# Patient Record
Sex: Male | Born: 1940 | Race: White | Marital: Married | Smoking: Former smoker
Health system: Southern US, Community
[De-identification: ages and names within clinical notes are randomized; demographics above are authoritative.]

## PROBLEM LIST (undated history)

## (undated) DIAGNOSIS — H9319 Tinnitus, unspecified ear: Secondary | ICD-10-CM

## (undated) DIAGNOSIS — R413 Other amnesia: Secondary | ICD-10-CM

## (undated) HISTORY — DX: Tinnitus, unspecified ear: H93.19

## (undated) HISTORY — DX: Other amnesia: R41.3

## (undated) HISTORY — PX: KNEE SURGERY: SHX244

---

## 2015-01-04 ENCOUNTER — Encounter: Payer: Self-pay | Admitting: Specialist

## 2015-01-10 ENCOUNTER — Other Ambulatory Visit (INDEPENDENT_AMBULATORY_CARE_PROVIDER_SITE_OTHER): Payer: Self-pay | Admitting: Otolaryngology

## 2015-01-10 DIAGNOSIS — H918X9 Other specified hearing loss, unspecified ear: Secondary | ICD-10-CM

## 2015-01-21 ENCOUNTER — Ambulatory Visit
Admission: RE | Admit: 2015-01-21 | Discharge: 2015-01-21 | Disposition: A | Discharge status: Home / Self Care | Payer: Medicare Other | Source: Ambulatory Visit | Attending: Otolaryngology | Admitting: Otolaryngology

## 2015-01-21 DIAGNOSIS — H918X9 Other specified hearing loss, unspecified ear: Secondary | ICD-10-CM

## 2015-01-21 MED ORDER — GADOBENATE DIMEGLUMINE 529 MG/ML IV SOLN
18.0000 mL | Freq: Once | INTRAVENOUS | Status: AC | PRN
  Administered 2015-01-21: 18 mL via INTRAVENOUS

## 2015-06-04 ENCOUNTER — Encounter: Payer: Self-pay | Admitting: Neurology

## 2015-06-04 ENCOUNTER — Telehealth: Payer: Self-pay | Admitting: Neurology

## 2015-06-04 ENCOUNTER — Ambulatory Visit (INDEPENDENT_AMBULATORY_CARE_PROVIDER_SITE_OTHER): Payer: Medicare Other | Admitting: Neurology

## 2015-06-04 VITALS — BP 116/62 | HR 61 | Ht 66.0 in | Wt 185.0 lb

## 2015-06-04 DIAGNOSIS — F0390 Unspecified dementia without behavioral disturbance: Secondary | ICD-10-CM

## 2015-06-04 MED ORDER — MEMANTINE HCL 10 MG PO TABS: 10.0000 mg | ORAL_TABLET | Freq: Two times a day (BID) | ORAL | Status: DC

## 2015-06-04 NOTE — Progress Notes (Addendum)
PATIENT: Carlos Cardenas DOB: 02/10/1941  Chief Complaint  Patient presents with  . Memory Loss    MMSE 21/30 - 5 animals. He is here with his wife, Carlos Cardenas.  Reports memory problems have been present for the last year.     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  Wife noticed he has gradual onset memory trouble around 2014, tends to repeat himself, got lost while driving, withdraw at the social event, distance himself from conversations, he put frozen T-cell with cardboard into the stove,   His sister was diagnosed with Alzheimer's disease at age eighties, his father died of age 76 from tuberculosis, mother died at age 76, does not has dementia,  He apparently has word finding difficulties during today's interview   Ported normal TSH, CBC, CMP  REVIEW OF SYSTEMS: Full 14 system review of systems performed and notable only for memory loss, confusion, ringing ears  ALLERGIES: No Known Allergies  HOME MEDICATIONS: Current Outpatient Prescriptions  Medication Sig Dispense Refill  . Apoaequorin (PREVAGEN) 10 MG CAPS Take 10 mg by mouth daily.    Marland Kitchen. ibuprofen (ADVIL,MOTRIN) 200 MG tablet Take 200 mg by mouth daily.     No current facility-administered medications for this visit.    PAST MEDICAL HISTORY: Past Medical History  Diagnosis Date  . Ringing in ears   . Memory loss     PAST SURGICAL HISTORY: Past Surgical History  Procedure Laterality Date  . Knee surgery Right     2000    FAMILY HISTORY: Family History  Problem Relation Age of Onset  . Heart disease Mother   . Tuberculosis  Father     SOCIAL HISTORY:  Social History   Social History  . Marital Status: Married    Spouse Name: N/A  . Number of Children: 2  . Years of Education: Some clg   Occupational History  . [employer] - Glass blower/designerswim teacher    Social History Main Topics  . Smoking status: Former Games developermoker  . Smokeless tobacco: Not on file     Comment: Quit 1965  . Alcohol Use: 0.0 oz/week    0 Standard drinks or equivalent per week     Comment: Seldom  . Drug Use: No  . Sexual Activity: Not on file   Other Topics Concern  . Not on file   Social History Narrative   Lives at home with his wife.   Left-handed.   2-3 cups caffeine per day.     PHYSICAL EXAM   Filed Vitals:   06/04/15 1038  BP: 116/62  Pulse: 61  Height: 5\' 6"  (1.676 m)  Weight: 185 lb (83.915 kg)    Not recorded      Body mass index is 29.87 kg/(m^2).  PHYSICAL EXAMNIATION:  Gen: NAD, conversant, well nourised, obese, well groomed                     Cardiovascular: Regular rate rhythm, no peripheral edema, warm, nontender. Eyes: Conjunctivae clear without exudates or hemorrhage Neck: Supple, no carotid bruise. Pulmonary: Clear to auscultation bilaterally   NEUROLOGICAL EXAM:  MENTAL  STATUS: Speech:    Speech is normal; fluent and spontaneous with normal comprehension.  Cognition: Mini-Mental Status Examination is 22 out of 30     Orientation to time, place and person: He is not oriented to year  Recent and remote memory: He missed 2 out of 3 recalls  Attention span and concentration: He has difficulty spell world backwards      Normal Language, naming, repeating,spontaneous speech     Fund of knowledge   CRANIAL NERVES: CN II: Visual fields are full to confrontation. Fundoscopic exam is normal with sharp discs and no vascular changes. Pupils are round equal and briskly reactive to light. CN III, IV, VI: extraocular movement are normal. No ptosis. CN V: Facial sensation is intact to pinprick in all 3 divisions  bilaterally. Corneal responses are intact.  CN VII: Face is symmetric with normal eye closure and smile. CN VIII: Hearing is normal to rubbing fingers CN IX, X: Palate elevates symmetrically. Phonation is normal. CN XI: Head turning and shoulder shrug are intact CN XII: Tongue is midline with normal movements and no atrophy.  MOTOR: There is no pronator drift of out-stretched arms. Muscle bulk and tone are normal. Muscle strength is normal.  REFLEXES: Reflexes are 2+ and symmetric at the biceps, triceps, knees, and ankles. Plantar responses are flexor.  SENSORY: Intact to light touch, pinprick, position sense, and vibration sense are intact in fingers and toes.  COORDINATION: Rapid alternating movements and fine finger movements are intact. There is no dysmetria on finger-to-nose and heel-knee-shin.    GAIT/STANCE: Posture is normal. Gait is steady with normal steps, base, arm swing, and turning. Heel and toe walking are normal. Tandem gait is normal.  Romberg is absent.   DIAGNOSTIC DATA (LABS, IMAGING, TESTING) - I reviewed patient records, labs, notes, testing and imaging myself where available.   ASSESSMENT AND PLAN  Carlos Cardenas is a 74 y.o. male  presented with gradual onset memory trouble, family history of dementia   Most consistent with Alzheimer's dementia  MRI of the brain showed generalized atrophy especially perisylvian  area atrophy, small vessel disease  Complete laboratory evaluations  Started Namenda 10 mg twice a day  He is very interested in research trial, information was provided  Return to clinic in 3 months  Carlos Cardenas, M.D. Ph.D.  [facility] [address] [place], [place] [address] Ph: [phone] Fax: [phone]  [id number] Carlos Laroche, MD

## 2015-06-04 NOTE — Telephone Encounter (Signed)
Carlos Cardenas and his wife met with Maida Sale to talk about the CREAD study.  The patient left their follow up papers in the office.  I called and spoke with Mrs. Zenon and she requested that I mail her the follow up paperwork.

## 2015-06-05 LAB — SEDIMENTATION RATE: Sed Rate: 4 mm/hr (ref 0–30)

## 2015-06-05 LAB — VITAMIN B12: Vitamin B-12: 522 pg/mL (ref 211–946)

## 2015-06-05 LAB — ANA W/REFLEX IF POSITIVE: Anti Nuclear Antibody(ANA): NEGATIVE

## 2015-06-05 LAB — C-REACTIVE PROTEIN: CRP: 3.8 mg/L (ref 0.0–4.9)

## 2015-06-05 LAB — RPR: RPR Ser Ql: NONREACTIVE

## 2015-06-10 ENCOUNTER — Telehealth: Payer: Self-pay | Admitting: *Deleted

## 2015-06-10 NOTE — Telephone Encounter (Signed)
-----   Message from Levert FeinsteinYijun Yan, MD sent at 06/05/2015 11:03 PM EST ----- Please call patient for normal laboratory result

## 2015-06-10 NOTE — Telephone Encounter (Signed)
Spoke to patient - aware of results. 

## 2015-07-09 ENCOUNTER — Telehealth: Payer: Self-pay

## 2015-07-09 NOTE — Telephone Encounter (Signed)
I spoke to the patient in regards to the CREAD study. The patient is interested in participating. Therefore, an FCSRT visit was scheduled for 23FEB2017.

## 2015-07-09 NOTE — Telephone Encounter (Signed)
I left a message for the patient to return my call.

## 2015-08-14 DIAGNOSIS — H903 Sensorineural hearing loss, bilateral: Secondary | ICD-10-CM | POA: Diagnosis not present

## 2015-09-03 ENCOUNTER — Telehealth: Payer: Self-pay

## 2015-09-03 ENCOUNTER — Encounter: Payer: Self-pay | Admitting: Neurology

## 2015-09-03 ENCOUNTER — Ambulatory Visit (INDEPENDENT_AMBULATORY_CARE_PROVIDER_SITE_OTHER): Payer: PPO | Admitting: Neurology

## 2015-09-03 VITALS — BP 132/65 | HR 62 | Ht 66.0 in | Wt 181.0 lb

## 2015-09-03 DIAGNOSIS — F0390 Unspecified dementia without behavioral disturbance: Secondary | ICD-10-CM

## 2015-09-03 MED ORDER — DONEPEZIL HCL 10 MG PO TABS: 10.0000 mg | ORAL_TABLET | Freq: Every day | ORAL | Status: DC

## 2015-09-03 NOTE — Telephone Encounter (Signed)
Patient returned my call in re the CREAD study. I explained to the patient that since Aricept has been prescribed by Dr. Terrace Arabia on 21FEB2017, we would need to wait for him to be on a stable dose for at least 3 months before becoming eligible for the study. I told him that we would give him a call in 3 months. Patient voiced understanding.

## 2015-09-03 NOTE — Progress Notes (Signed)
Chief Complaint  Patient presents with  . Memory Loss    MMSE 25/30 - 4 animals.  He is here with his wife, Carlos Cardenas. He is having problems tolerating Namenda.  Says he often feels dizzy and has vision changes after his doses.      PATIENT: Carlos Cardenas DOB: 1940-11-16  Chief Complaint  Patient presents with  . Memory Loss    MMSE 25/30 - 4 animals.  He is here with his wife, Carlos Cardenas. He is having problems tolerating Namenda.  Says he often feels dizzy and has vision changes after his doses.     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  Wife noticed he has gradual onset memory trouble around 2014, tends to repeat himself, got lost while driving, withdraw at the social event, distance himself from conversations, he put frozen T-cell with cardboard into the stove,   His sister was diagnosed with Alzheimer's disease at age 24, his father died of age 31 from tuberculosis, mother died at age 66, does not has dementia,  He apparently has word finding difficulties during today's interview   Reported normal TSH, CBC, CMP  Update September 03 2015: He started to take Namenda 10 mg twice a day since November 2016, complains of mild blurry vision, on further questioning, he has developed nearsighted, only get over-the-counter reading glasses, I have suggested him seeing his optometrist.  We have personally reviewed MRI of the brain in July 2016, generalized atrophy, more at the perisylvarian fissure, left worse than right, mild supratentorium small vessel disease  I reviewed laboratory evaluation, normal on  negative ANA, ESR, C-reactive protein, B12, RPR     REVIEW OF SYSTEMS: Full 14 system review of systems performed and notable only for ringing ears, runny nose, double vision, blurred vision, constipation, back pain, memory loss, confusion  ALLERGIES: No Known Allergies  HOME MEDICATIONS: Current Outpatient Prescriptions  Medication Sig Dispense Refill  . Apoaequorin (PREVAGEN PO) Take by mouth daily.    . Magnesium 400 MG CAPS Take by mouth daily.    . memantine (NAMENDA) 10 MG tablet Take 1 tablet (10 mg total) by mouth 2 (two) times daily. 60 tablet 11  . Multiple Vitamins-Minerals (MULTIVITAMIN ADULT PO) Take by mouth daily.    . Omega-3 Fatty Acids (FISH OIL) 1000 MG CAPS Take 2,000 mg by mouth daily.    . vitamin E 400 UNIT capsule Take 400 Units by mouth daily.     No current facility-administered medications for this visit.    PAST MEDICAL HISTORY: Past Medical History  Diagnosis Date  . Ringing in ears   . Memory loss     PAST SURGICAL HISTORY: Past Surgical History  Procedure Laterality Date  . Knee surgery Right     2000    FAMILY HISTORY: Family History  Problem Relation Age of Onset  . Heart disease Mother   . Tuberculosis Father     SOCIAL HISTORY:  Social History   Social History  . Marital Status: Married    Spouse Name: N/A  . Number of Children: 2  . Years of Education: Some clg  Occupational History  . 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 Smoking status: Former Research scientist (life sciences)  . Smokeless tobacco: Not on file     Comment: Quit 1965  . Alcohol Use: 0.0 oz/week    0 Standard drinks or equivalent per week     Comment: Seldom  . Drug Use: No  . Sexual Activity: Not on file   Other Topics Concern  . Not on file   Social History Narrative   Lives at home with his wife.   Left-handed.   2-3 cups caffeine per day.     PHYSICAL EXAM   Filed Vitals:   09/03/15 0840  BP: 132/65  Pulse: 62  Height: 5' 6"  (1.676 m)    Weight: 181 lb (82.101 kg)    Not recorded      Body mass index is 29.23 kg/(m^2).  PHYSICAL EXAMNIATION:  Gen: NAD, conversant, well nourised, obese, well groomed                     Cardiovascular: Regular rate rhythm, no peripheral edema, warm, nontender. Eyes: Conjunctivae clear without exudates or hemorrhage Neck: Supple, no carotid bruise. Pulmonary: Clear to auscultation bilaterally   NEUROLOGICAL EXAM:  MENTAL STATUS: Speech:    Speech is normal; fluent and spontaneous with normal comprehension.  Cognition: Mini-Mental Status Examination is 25 out of 30     Orientation to time, place and person: He is not oriented to date  Recent and remote memory: He missed 1 out of 3 recalls  Attention span and concentration: He has difficulty spell world backwards      Normal Language, naming, repeating,spontaneous speech     Fund of knowledge   CRANIAL NERVES: CN II: Visual fields are full to confrontation. Fundoscopic exam is normal with sharp discs and no vascular changes. Pupils are round equal and briskly reactive to light. CN III, IV, VI: extraocular movement are normal. No ptosis. CN V: Facial sensation is intact to pinprick in all 3 divisions bilaterally. Corneal responses are intact.  CN VII: Face is symmetric with normal eye closure and smile. CN VIII: Hearing is normal to rubbing fingers CN IX, X: Palate elevates symmetrically. Phonation is normal. CN XI: Head turning and shoulder shrug are intact CN XII: Tongue is midline with normal movements and no atrophy.  MOTOR: There is no pronator drift of out-stretched arms. Muscle bulk and tone are normal. Muscle strength is normal.  REFLEXES: Reflexes are 2+ and symmetric at the biceps, triceps, knees, and ankles. Plantar responses are flexor.  SENSORY: Intact to light touch, pinprick, position sense, and vibration sense are intact in fingers and toes.  COORDINATION: Rapid alternating movements and fine finger  movements are intact. There is no dysmetria on finger-to-nose and heel-knee-shin.    GAIT/STANCE: Posture is normal. Gait is steady with normal steps, base, arm swing, and turning. Heel and toe walking are normal. Tandem gait is normal.  Romberg is absent.   DIAGNOSTIC DATA (LABS, IMAGING, TESTING) - I reviewed patient records, labs, notes, testing and imaging myself where available.   ASSESSMENT AND PLAN  Carlos Cardenas is a 75 y.o. male  presented with gradual onset memory trouble, family history of dementia   Most consistent with Alzheimer's dementia  We have personally reviewed MRI of the brain showed generalized atrophy especially perisylvian  area atrophy, small vessel disease  Laboratory evaluations Showed no treatable cause  Continue Namenda 10 mg twice a day  Add on Aricept 10 mg daily   Patient and his wife are very interested in research trial, information was provided  Return to clinic in 3 months  Carlos Cardenas, M.D. Ph.D.  [facility] [address], [place],  [address] Ph: [phone] Fax: [phone]  [id number] Carlos Roller, MD

## 2015-09-03 NOTE — Telephone Encounter (Signed)
I left a message for the patient to return my call.

## 2015-09-17 DIAGNOSIS — G245 Blepharospasm: Secondary | ICD-10-CM | POA: Diagnosis not present

## 2015-09-17 DIAGNOSIS — H5212 Myopia, left eye: Secondary | ICD-10-CM | POA: Diagnosis not present

## 2015-09-17 DIAGNOSIS — H2513 Age-related nuclear cataract, bilateral: Secondary | ICD-10-CM | POA: Diagnosis not present

## 2015-12-02 ENCOUNTER — Encounter: Payer: Self-pay | Admitting: Neurology

## 2015-12-02 ENCOUNTER — Ambulatory Visit (INDEPENDENT_AMBULATORY_CARE_PROVIDER_SITE_OTHER): Payer: PPO | Admitting: Neurology

## 2015-12-02 VITALS — BP 135/66 | HR 53 | Ht 66.0 in | Wt 173.5 lb

## 2015-12-02 DIAGNOSIS — G3184 Mild cognitive impairment, so stated: Secondary | ICD-10-CM

## 2015-12-02 MED ORDER — DONEPEZIL HCL 10 MG PO TABS: 10.0000 mg | ORAL_TABLET | Freq: Every day | ORAL | Status: DC

## 2015-12-02 MED ORDER — MEMANTINE HCL 10 MG PO TABS: 10.0000 mg | ORAL_TABLET | Freq: Two times a day (BID) | ORAL | Status: DC

## 2015-12-02 NOTE — Progress Notes (Signed)
Chief Complaint  Patient presents with  . Dementia    MMSE 27/30 - 5 animals.  He is here with his wife, Pamala Hurry. Reports memory to be about the same.  His wife feels there has been an improvement since being on both Aricept and Namenda.      PATIENT: Carlos Cardenas DOB: 10/27/1940  Chief Complaint  Patient presents with  . Dementia    MMSE 27/30 - 5 animals.  He is here with his wife, Pamala Hurry. Reports memory to be about the same.  His wife feels there has been an improvement since being on both Aricept and Namenda.     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  Wife noticed he has gradual onset memory trouble around 2014, tends to repeat himself, got lost while driving, withdraw at the social event, distance himself from conversations, he put frozen T-cell with cardboard into the stove,   His sister was diagnosed with Alzheimer's disease at age 48, his father died of age 72 from tuberculosis, mother died at age 90, does not has dementia,  He apparently has word finding difficulties during today's interview   Reported normal TSH, CBC, CMP  Update September 03 2015: He started to take Namenda 10 mg twice a day since November 2016, complains of mild blurry vision, on further questioning, he has developed nearsighted, only get over-the-counter reading glasses, I have suggested him seeing his optometrist.  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 Apoaequorin (PREVAGEN PO) Take by mouth daily.    Marland Kitchen donepezil (ARICEPT) 10 MG tablet Take 1 tablet (10 mg total) by mouth at bedtime. 30 tablet 11  . Magnesium 400 MG CAPS Take by mouth daily.    . memantine (NAMENDA) 10 MG tablet Take 1 tablet (10 mg total) by mouth 2 (two) times daily. 60 tablet 11  . Multiple Vitamins-Minerals (MULTIVITAMIN ADULT PO) Take by mouth daily.    . Omega-3 Fatty Acids (FISH OIL) 1000 MG CAPS Take 2,000 mg by mouth daily.    . vitamin E 400 UNIT capsule Take 400 Units by mouth daily.     No current facility-administered medications for this visit.    PAST MEDICAL HISTORY: Past Medical History  Diagnosis Date  . Ringing in ears   . Memory loss     PAST SURGICAL HISTORY: Past Surgical History  Procedure Laterality  Date  . Knee surgery Right     2000    FAMILY HISTORY: Family History  Problem Relation Age of Onset  . Heart disease Mother   . Tuberculosis Father     SOCIAL HISTORY:  Social History   Social History  . Marital Status: Married    Spouse Name: N/A  . Number of Children: 2  . Years of Education: Some clg   Occupational History  . 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 Smoking status: Former Research scientist (life sciences)  . Smokeless tobacco: Not on file     Comment: Quit 1965  .  Alcohol Use: 0.0 oz/week    0 Standard drinks or equivalent per week     Comment: Seldom  . Drug Use: No  . Sexual Activity: Not on file   Other Topics Concern  . Not on file   Social History Narrative   Lives at home with his wife.   Left-handed.   2-3 cups caffeine per day.     PHYSICAL EXAM   Filed Vitals:   12/02/15 1411  BP: 135/66  Pulse: 53  Height: _0  (1.676 m)  Weight: 173 lb 8 oz (78.699 kg)    Not recorded      Body mass index is 28.02 kg/(m^2).  PHYSICAL EXAMNIATION:  Gen: NAD, conversant, well nourised, obese, well groomed                     Cardiovascular: Regular rate rhythm, no peripheral edema, warm, nontender. Eyes: Conjunctivae clear without exudates or hemorrhage Neck: Supple, no carotid bruise. Pulmonary: Clear to auscultation bilaterally   NEUROLOGICAL EXAM:  MENTAL STATUS: Speech:    Speech is normal; fluent and spontaneous with normal comprehension.  Cognition: Mini-Mental Status Examination is 27 out of 30, animal naming 5     Orientation to time, place and person: He is not oriented to doctor's office  Recent and remote memory: He missed 1 out of 3 recalls  Attention span and concentration: He has difficulty spell world backwards      Normal Language, naming, repeating,spontaneous speech, mild difficulty copy design     Fund of knowledge   CRANIAL NERVES: CN II: Visual fields are full to confrontation. Fundoscopic exam is normal with sharp discs and no vascular changes. Pupils are round equal and briskly reactive to light. CN III, IV, VI: extraocular movement are normal. No ptosis. CN V: Facial sensation is intact to pinprick in all 3 divisions bilaterally. Corneal responses are intact.  CN VII: Face is symmetric with normal eye closure and smile. CN VIII: Hearing is normal to rubbing fingers CN IX, X: Palate elevates symmetrically. Phonation is normal. CN XI: Head turning and shoulder shrug are intact CN XII: Tongue is midline  with normal movements and no atrophy.  MOTOR: There is no pronator drift of out-stretched arms. Muscle bulk and tone are normal. Muscle strength is normal.  REFLEXES: Reflexes are 2+ and symmetric at the biceps, triceps, knees, and ankles. Plantar responses are flexor.  SENSORY: Intact to light touch, pinprick, position sense, and vibration sense are intact in fingers and toes.  COORDINATION: Rapid alternating movements and fine finger movements are intact. There is no dysmetria on finger-to-nose and heel-knee-shin.    GAIT/STANCE: Posture is normal. Gait is steady with normal steps, base, arm swing, and turning. Heel and toe walking are normal. Tandem gait is normal.  Romberg is absent.   DIAGNOSTIC DATA (LABS, IMAGING,  TESTING) - I reviewed patient records, labs, notes, testing and imaging myself where available.   ASSESSMENT AND PLAN  Carlos Cardenas is a 75 y.o. male  presented with gradual onset memory trouble, family history of dementia   Most consistent with mild Alzheimer's dementia  We have personally reviewed MRI of the brain showed generalized atrophy especially perisylvian  area atrophy, small vessel disease  Laboratory evaluations Showed no treatable cause  Continue Namenda 10 mg twice a day   Continue Aricept 10 mg daily     Marcial Pacas, M.D. Ph.D.  [facility] [address] [place], [place] [address] Ph: [phone] Fax: [phone]  [id number] Carmelia Roller, MD

## 2015-12-02 NOTE — Addendum Note (Signed)
Addended by: Levert FeinsteinYAN, Karaline Buresh on: 12/02/2015 03:01 PM   Modules accepted: Orders

## 2015-12-31 ENCOUNTER — Emergency Department (HOSPITAL_COMMUNITY)
Admission: EM | Admit: 2015-12-31 | Discharge: 2015-12-31 | Disposition: A | Discharge status: Home / Self Care | Payer: PPO | Attending: Emergency Medicine | Admitting: Emergency Medicine

## 2015-12-31 ENCOUNTER — Encounter (HOSPITAL_COMMUNITY): Payer: Self-pay | Admitting: Emergency Medicine

## 2015-12-31 ENCOUNTER — Emergency Department (HOSPITAL_COMMUNITY): Payer: PPO

## 2015-12-31 ENCOUNTER — Ambulatory Visit (HOSPITAL_COMMUNITY)
Admission: EM | Admit: 2015-12-31 | Discharge: 2015-12-31 | Disposition: A | Discharge status: Home / Self Care | Payer: Medicare Other

## 2015-12-31 DIAGNOSIS — Y929 Unspecified place or not applicable: Secondary | ICD-10-CM | POA: Insufficient documentation

## 2015-12-31 DIAGNOSIS — Y999 Unspecified external cause status: Secondary | ICD-10-CM | POA: Insufficient documentation

## 2015-12-31 DIAGNOSIS — Z79899 Other long term (current) drug therapy: Secondary | ICD-10-CM | POA: Diagnosis not present

## 2015-12-31 DIAGNOSIS — Z87891 Personal history of nicotine dependence: Secondary | ICD-10-CM | POA: Diagnosis not present

## 2015-12-31 DIAGNOSIS — X58XXXA Exposure to other specified factors, initial encounter: Secondary | ICD-10-CM | POA: Insufficient documentation

## 2015-12-31 DIAGNOSIS — T189XXA Foreign body of alimentary tract, part unspecified, initial encounter: Secondary | ICD-10-CM

## 2015-12-31 DIAGNOSIS — Y939 Activity, unspecified: Secondary | ICD-10-CM | POA: Insufficient documentation

## 2015-12-31 DIAGNOSIS — T182XXA Foreign body in stomach, initial encounter: Secondary | ICD-10-CM | POA: Insufficient documentation

## 2015-12-31 NOTE — Discharge Instructions (Signed)
Swallowed Foreign Body, Adult °A swallowed foreign body means that you swallow something and it gets stuck. It might be food or something else. The object may get stuck in the tube that connects your throat to your stomach (esophagus), or it may get stuck in another part of your belly (digestive tract). It is very important to tell your doctor what you have swallowed. °Sometimes, the object will pass through your body on its own. Sometimes, the object will pass after you are given a medicine to relax your throat. The object may need to be taken out by a doctor if it is dangerous or if it will not pass through your body on its own. An object may need to be removed with surgery if: °· It gets stuck in your throat. °· You cannot swallow. °· You cannot breathe well. °· It is sharp. °· It is harmful or poisonous (toxic), like batteries or drugs. °HOME CARE °· Eat what you normally eat if your doctor says that you can. °· Keep checking your poop (stool) to see if the object has come out of your body. °· Call your doctor if the object has not come out of your body after 3 days. °If you had surgery (endoscopy) to remove the object: °· Care for yourself after surgery as told by your doctor. °Keep all follow-up visits as told by your doctor. This is important. °SEEK MEDICAL CARE IF: °· You still have problems after you have been treated. °· The object has not come out of your body after 3 days. °GET HELP RIGHT AWAY IF: °· You have a fever. °· You have pain in your chest or your belly. °· You cough up blood. °· You have blood in your poop or your throw up (vomit). °  °This information is not intended to replace advice given to you by your health care provider. Make sure you discuss any questions you have with your health care provider. °  °Document Released: 10/14/2010 Document Revised: 03/20/2015 Document Reviewed: 09/26/2014 °Elsevier Interactive Patient Education ©2016 [employer] ° °

## 2015-12-31 NOTE — ED Notes (Signed)
Pt states "I swallowed my bridge for my teeth". Airway intact, respirations equal and unlabored. Denies pain.

## 2015-12-31 NOTE — ED Provider Notes (Signed)
CSN: [id number]     Arrival date & time 12/31/15  1901 History   First MD Initiated Contact with Patient 12/31/15 2207     Chief Complaint  Patient presents with  . Foreign Body     (Consider location/radiation/quality/duration/timing/severity/associated sxs/prior Treatment) HPI   75 year old male who swallowed his bridge for his teeth this evening. He is not have any foreign body sensation and has not had any difficulty breathing or swallowing. He has no pain.  Past Medical History  Diagnosis Date  . Ringing in ears   . Memory loss    Past Surgical History  Procedure Laterality Date  . Knee surgery Right     2000   Family History  Problem Relation Age of Onset  . Heart disease Mother   . Tuberculosis Father    Social History  Substance Use Topics  . Smoking status: Former Games developermoker  . Smokeless tobacco: None     Comment: Quit 1965  . Alcohol Use: 0.0 oz/week    0 Standard drinks or equivalent per week     Comment: Seldom    Review of Systems  All other systems reviewed and are negative.     Allergies  Review of patient's allergies indicates no known allergies.  Home Medications   Prior to Admission medications   Medication Sig Start Date End Date Taking? Authorizing Provider  Apoaequorin (PREVAGEN PO) Take by mouth daily.    Historical Provider, MD  donepezil (ARICEPT) 10 MG tablet Take 1 tablet (10 mg total) by mouth at bedtime. 12/02/15   Levert FeinsteinYijun Yan, MD  Magnesium 400 MG CAPS Take by mouth daily.    Historical Provider, MD  memantine (NAMENDA) 10 MG tablet Take 1 tablet (10 mg total) by mouth 2 (two) times daily. 12/02/15   Levert FeinsteinYijun Yan, MD  Multiple Vitamins-Minerals (MULTIVITAMIN ADULT PO) Take by mouth daily.    Historical Provider, MD  Omega-3 Fatty Acids (FISH OIL) 1000 MG CAPS Take 2,000 mg by mouth daily.    Historical Provider, MD  vitamin E 400 UNIT capsule Take 400 Units by mouth daily.    Historical Provider, MD   BP 147/78 mmHg  Pulse 62  Temp(Src)  98.4 F (36.9 C) (Oral)  Resp 18  SpO2 100% Physical Exam  Constitutional: He is oriented to person, place, and time. He appears well-developed.  HENT:  Head: Normocephalic and atraumatic.  Right Ear: External ear normal.  Left Ear: External ear normal.  Nose: Nose normal.  Eyes: EOM are normal.  Neck: No tracheal deviation present.  Cardiovascular: Normal rate.   Pulmonary/Chest: Effort normal.  Abdominal: Soft. Bowel sounds are normal. There is no tenderness.  Musculoskeletal: Normal range of motion.  Neurological: He is alert and oriented to person, place, and time.  Skin: Skin is warm and dry.  Psychiatric: He has a normal mood and affect. His behavior is normal.  Nursing note and vitals reviewed.   ED Course  Procedures (including critical care time) Labs Review Labs Reviewed - No data to display  Imaging Review Dg Abd 1 View  12/31/2015  CLINICAL DATA:  Swallowed dental bridge.  Initial encounter. EXAM: ABDOMEN - 1 VIEW COMPARISON:  None. FINDINGS: A dental bridge is noted overlying the body of the stomach. The stomach appears to partially extend above the diaphragm, and a hiatal hernia cannot be excluded. The visualized bowel gas pattern is grossly unremarkable. No free intra-abdominal air is seen, though evaluation for free air is limited on a single supine view.  No acute osseous abnormalities are seen. IMPRESSION: Dental bridge noted overlying the body of the stomach. The stomach appears to partially extend above the diaphragm, and a hiatal hernia cannot be excluded. Electronically Signed   By: Roanna Raider M.D.   On: 12/31/2015 20:49   I have personally reviewed and evaluated these images and lab results as part of my medical decision-making.   EKG Interpretation None      MDM   Final diagnoses:  Swallowed foreign body, initial encounter    Patient swallowed partial bridge of teeth and x-Venice Liz obtained here shows that partial bridge is in the stomach. I have  discussed return precautions with the patient and his wife. They voice understanding.    Margarita Grizzle, MD 12/31/15 2217

## 2016-02-11 DIAGNOSIS — H903 Sensorineural hearing loss, bilateral: Secondary | ICD-10-CM | POA: Diagnosis not present

## 2016-09-30 DIAGNOSIS — H52221 Regular astigmatism, right eye: Secondary | ICD-10-CM | POA: Diagnosis not present

## 2016-09-30 DIAGNOSIS — H524 Presbyopia: Secondary | ICD-10-CM | POA: Diagnosis not present

## 2016-09-30 DIAGNOSIS — H5211 Myopia, right eye: Secondary | ICD-10-CM | POA: Diagnosis not present

## 2016-11-09 ENCOUNTER — Encounter: Payer: Self-pay | Admitting: Neurology

## 2016-11-09 ENCOUNTER — Ambulatory Visit (INDEPENDENT_AMBULATORY_CARE_PROVIDER_SITE_OTHER): Payer: PPO | Admitting: Neurology

## 2016-11-09 ENCOUNTER — Encounter (INDEPENDENT_AMBULATORY_CARE_PROVIDER_SITE_OTHER): Payer: Self-pay

## 2016-11-09 VITALS — BP 146/71 | HR 67 | Ht 66.0 in | Wt 189.0 lb

## 2016-11-09 DIAGNOSIS — F0280 Dementia in other diseases classified elsewhere without behavioral disturbance: Secondary | ICD-10-CM

## 2016-11-09 DIAGNOSIS — G309 Alzheimer's disease, unspecified: Secondary | ICD-10-CM

## 2016-11-09 MED ORDER — DONEPEZIL HCL 10 MG PO TABS: 10.0000 mg | ORAL_TABLET | Freq: Every day | ORAL | 4 refills | Status: AC

## 2016-11-09 MED ORDER — MEMANTINE HCL 10 MG PO TABS: 10.0000 mg | ORAL_TABLET | Freq: Two times a day (BID) | ORAL | 4 refills | Status: AC

## 2016-11-09 NOTE — Progress Notes (Signed)
Chief Complaint  Patient presents with  . Memory Loss    MMSE 15/30 - 1 animals.  He is here with his wife, Pamala Hurry, who feels he is having more difficulty following directions, increased confusion and worsening memory.      PATIENT: Carlos Cardenas DOB: 1941/06/01  Chief Complaint  Patient presents with  . Memory Loss    MMSE 15/30 - 1 animals.  He is here with his wife, Pamala Hurry, who feels he is having more difficulty following directions, increased confusion and worsening memory.     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  Wife noticed he has gradual onset memory trouble around 2014, tends to repeat himself, got lost while driving, withdraw at the social event, distance himself from conversations, he put frozen T-cell with cardboard into the stove,   His sister was diagnosed with Alzheimer's disease at age 3, his father died of age 34 from tuberculosis, mother died at age 55, does not has dementia,  He apparently has word finding difficulties during today's interview   Reported normal TSH, CBC, CMP  Update September 03 2015: He started to take Namenda 10 mg twice a day since November 2016, complains of mild blurry vision, on further questioning, he has developed nearsighted, only get over-the-counter reading glasses, I have suggested him seeing his optometrist.  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, He is interested in research trial, he has mild difficulty sleeping in the past, has improved after stopping caffeine beverage late evening  Update November 09 2016: He has worsening memory loss, has quit driving, become withdrawal from his social scenario, tolerating Namenda 10 mg twice a day, Aricept 10 mg daily, ambulate without difficulty, good temperament, no difficulty sleeping,  REVIEW OF SYSTEMS: Full 14 system review of systems performed and notable only for  fatigue, hearing loss, runny nose, memory loss, dizziness, confusion, anxiety  ALLERGIES: No Known Allergies  HOME MEDICATIONS: Current Outpatient Prescriptions  Medication Sig Dispense Refill  . donepezil (ARICEPT) 10 MG tablet Take 1 tablet (10 mg total) by mouth at bedtime. 90 tablet 4  . Magnesium 400 MG CAPS Take by mouth daily.    . memantine (NAMENDA) 10 MG tablet Take 1 tablet (10 mg total) by mouth 2 (two) times daily. 180 tablet 4  . Multiple Vitamins-Minerals (MULTIVITAMIN ADULT PO) Take by mouth daily.    . Omega-3 Fatty Acids (FISH OIL) 1000 MG CAPS Take 2,000 mg by mouth daily.    . vitamin B-12 (CYANOCOBALAMIN) 1000 MCG tablet Take 1,000 mcg by mouth daily.     No current facility-administered medications for this visit.     PAST MEDICAL HISTORY: Past Medical History:  Diagnosis Date  . Memory loss   . Ringing in  ears     PAST SURGICAL HISTORY: Past Surgical History:  Procedure Laterality Date  . KNEE SURGERY Right    2000    FAMILY HISTORY: Family History  Problem Relation Age of Onset  . Heart disease Mother   . Tuberculosis Father     SOCIAL HISTORY:  Social History   Social History  . Marital status: Married    Spouse name: N/A  . Number of children: 2  . Years of education:  Some clg   Occupational History  . 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 Smoking status: Former Research scientist (life sciences)  . Smokeless tobacco: Never Used     Comment: Quit [id number]  . Alcohol use 0.0 oz/week     Comment: Seldom  . Drug use: No  . Sexual activity: Not on file   Other Topics Concern  . Not on file   Social History Narrative   Lives at home with his wife.   Left-handed.   2-3 cups caffeine per day.     PHYSICAL EXAM   Vitals:   11/09/16 1432  BP: (!) 146/71  Pulse: 67  Weight: 189 lb (85.7 kg)  Height: _0  (1.676 m)    Not recorded      Body mass index is 30.51 kg/m.  PHYSICAL EXAMNIATION:  Gen: NAD, conversant, well nourised, obese, well groomed                     Cardiovascular: Regular rate rhythm, no peripheral edema, warm, nontender. Eyes: Conjunctivae clear without exudates or hemorrhage Neck: Supple, no carotid bruise. Pulmonary: Clear to auscultation bilaterally   NEUROLOGICAL EXAM: animal naming 1 MMSE - Mini Mental State Exam 11/09/2016 12/02/2015 09/03/2015  Orientation to time _1 Orientation to Place _2 Registration _3 Attention/ Calculation 0 5 3  Recall 0 2 2  Language- name 2 objects _4 Language- repeat _5 Language- follow 3 step command _6 Language- read & follow direction _7 Write a sentence 0 1 1  Copy design 0 0 1  Total score _8 CRANIAL NERVES: CN II: Visual fields are full to confrontation. Fundoscopic exam is normal with sharp discs and no vascular changes. Pupils are round equal and briskly reactive to light. CN III, IV, VI: extraocular movement are normal. No ptosis. CN V: Facial sensation is intact to pinprick in all 3 divisions bilaterally. Corneal responses are intact.  CN VII: Face is symmetric with normal eye closure and smile. CN VIII: Hearing is normal to rubbing fingers CN IX, X: Palate elevates symmetrically. Phonation is normal. CN XI: Head turning and shoulder shrug  are intact CN XII: Tongue is midline with normal movements and no atrophy.  MOTOR: There is no pronator drift of out-stretched arms. Muscle bulk and tone are normal. Muscle strength is normal.  REFLEXES: Reflexes are 2+ and symmetric at the biceps, triceps, knees, and ankles. Plantar responses are flexor.  SENSORY: Intact to light touch, pinprick, position sense, and vibration sense are intact in fingers and toes.  COORDINATION: Rapid alternating movements and fine finger movements are intact. There is no dysmetria on finger-to-nose and heel-knee-shin.    GAIT/STANCE: Posture is normal. Gait is steady with normal steps, base, arm swing, and turning. Heel and toe walking are normal. Tandem gait is normal.  Romberg is absent.  DIAGNOSTIC DATA (LABS, IMAGING, TESTING) - I reviewed patient records, labs, notes, testing and imaging myself where available.   ASSESSMENT AND PLAN  VICTORMANUEL MCLURE is a 76 y.o. male  presented with gradual onset memory trouble, family history of dementia   Alzheimer's dementia  We have personally reviewed MRI of the brain showed generalized atrophy especially perisylvian  area atrophy, small vessel disease  Slow worsening  Laboratory evaluations Showed no treatable cause  Continue Namenda 10 mg twice a day   Continue Aricept 10 mg daily   I have referred him to research trial for dementia    Marcial Pacas, M.D. Ph.D.  [facility] [address], [place], [place] [address] Ph: [phone] Fax: [phone]  UD:[id number] Carmelia Roller, MD

## 2016-11-10 LAB — C-REACTIVE PROTEIN: CRP: 1.6 mg/L (ref 0.0–4.9)

## 2016-11-10 LAB — VITAMIN B12: Vitamin B-12: 1553 pg/mL — ABNORMAL HIGH (ref 232–1245)

## 2016-11-10 LAB — ANA W/REFLEX: ANA: NEGATIVE

## 2016-11-10 LAB — SEDIMENTATION RATE: SED RATE: 2 mm/h (ref 0–30)

## 2016-11-10 LAB — TSH: TSH: 0.902 u[IU]/mL (ref 0.450–4.500)

## 2017-03-17 DIAGNOSIS — H903 Sensorineural hearing loss, bilateral: Secondary | ICD-10-CM | POA: Diagnosis not present

## 2017-05-13 ENCOUNTER — Ambulatory Visit (INDEPENDENT_AMBULATORY_CARE_PROVIDER_SITE_OTHER): Payer: PPO | Admitting: Neurology

## 2017-05-13 ENCOUNTER — Encounter: Payer: Self-pay | Admitting: Neurology

## 2017-05-13 VITALS — BP 121/60 | HR 68 | Ht 66.0 in | Wt 180.5 lb

## 2017-05-13 DIAGNOSIS — G309 Alzheimer's disease, unspecified: Secondary | ICD-10-CM

## 2017-05-13 DIAGNOSIS — F0280 Dementia in other diseases classified elsewhere without behavioral disturbance: Secondary | ICD-10-CM | POA: Diagnosis not present

## 2017-05-13 NOTE — Progress Notes (Signed)
Chief Complaint  Patient presents with  . Follow-up    New Room with wife, Carlos Cardenas. Last seen 11/09/16. Back on October 8th, wife concerned pt had possible TIA. She went out for a little bit and came home to found him in shower collapsed. She is not sure how long he was there. She has noticed that since, right leg dragging. He is unsteady while walking. He can no longer dress bottom half of himself. Needs assistance with this.  . Memory Loss    Last MMSE 15/30. Today's MMSE 08/30. Wife feels there has been a significant decline in memory since last office visit.      PATIENT: Carlos Cardenas DOB: [birth year]  Chief Complaint  Patient presents with  . Follow-up    New Room with wife, Carlos Cardenas. Last seen 11/09/16. Back on October 8th, wife concerned pt had possible TIA. She went out for a little bit and came home to found him in shower collapsed. She is not sure how long he was there. She has noticed that since, right leg dragging. He is unsteady while walking. He can no longer dress bottom half of himself. Needs assistance with this.  . Memory Loss    Last MMSE 15/30. Today's MMSE 08/30. Wife feels there has been a significant decline in memory since last office visit.     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  Wife noticed he has gradual onset memory trouble around 2014, tends to repeat himself, got lost while driving, withdraw at the social event, distance himself from conversations, he put frozen pizza with cardboard into the stove,   His sister was diagnosed with Alzheimer's disease at age 33, his father  died of age 78 from tuberculosis, mother died at age 53, does not has dementia,  He apparently has word finding difficulties during today's interview   Reported normal TSH, CBC, CMP  Update September 03 2015: He started to take Namenda 10 mg twice a day since November 2016, complains of mild blurry vision, on further questioning, he has developed nearsighted, only get over-the-counter reading glasses, I have suggested him seeing his optometrist.  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  REVIEW OF SYSTEMS: Full 14 system review of systems performed  and notable only for  chills, memory loss, walking difficulty, tremor, confusion, depression  ALLERGIES: No Known Allergies  HOME MEDICATIONS: Current Outpatient Prescriptions  Medication Sig Dispense Refill  . B Complex Vitamins (VITAMIN B-COMPLEX PO) Take by mouth.    . donepezil (ARICEPT) 10 MG tablet Take 1 tablet (10 mg total) by mouth at bedtime. 90 tablet 4  . Magnesium 400 MG CAPS Take by mouth daily.    .  memantine (NAMENDA) 10 MG tablet Take 1 tablet (10 mg total) by mouth 2 (two) times daily. 180 tablet 4  . Multiple Vitamins-Minerals (MULTIVITAMIN ADULT PO) Take by mouth daily.    . Omega-3 Fatty Acids (FISH OIL) 1000 MG CAPS Take 2,000 mg by mouth daily.    . tamsulosin (FLOMAX) 0.4 MG CAPS capsule Take 0.4 mg by mouth daily.    . vitamin B-12 (CYANOCOBALAMIN) 1000 MCG tablet Take 1,000 mcg by mouth daily.     No current facility-administered medications for this visit.     PAST MEDICAL HISTORY: Past Medical History:  Diagnosis Date  . Memory loss   . Ringing in ears     PAST SURGICAL HISTORY: Past Surgical History:  Procedure Laterality Date  . KNEE SURGERY Right    2000    FAMILY HISTORY: Family History  Problem Relation Age of Onset  . Heart disease Mother   . Tuberculosis Father     SOCIAL HISTORY:  Social History   Social History  . Marital status: Married    Spouse name: N/A  . Number of children: 2  . Years of education: Some clg   Occupational History  . 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 Smoking status: Former Research scientist (life sciences)  . Smokeless tobacco: Never Used     Comment: Quit [id number]  . Alcohol use 0.0 oz/week     Comment: Seldom  . Drug use: No  . Sexual activity: Not on file   Other Topics Concern  . Not on file   Social History Narrative   Lives at home with his wife.   Left-handed.   2-3 cups caffeine per day.     PHYSICAL EXAM   Vitals:   05/13/17 1119  BP: 121/60  Pulse: 68  Weight: 180 lb 8 oz (81.9 kg)  Height: _0  (1.676 m)    Not recorded      Body mass index is 29.13 kg/m.  PHYSICAL EXAMNIATION:  Gen: NAD, conversant, well nourised, obese, well groomed                     Cardiovascular: Regular rate rhythm, no peripheral edema, warm, nontender. Eyes: Conjunctivae clear without exudates or hemorrhage Neck: Supple, no carotid bruise. Pulmonary: Clear to auscultation bilaterally   NEUROLOGICAL EXAM:    He dependent on his wife to answer all the questions  MMSE - Mini Mental State Exam 05/13/2017 11/09/2016 12/02/2015  Orientation to time 0 1 5  Orientation to Place 0 4 4  Registration _1 Attention/ Calculation 0 0 5  Recall 1 0 2  Language- name 2 objects _2 Language- repeat _3 Language- follow 3 step command _4 Language- read & follow direction 0 1 1  Write a sentence 0 0 1  Copy design 0 0 0  Total score _5 CRANIAL NERVES: CN II: Visual fields are full to confrontation. Fundoscopic exam is  normal with sharp discs and no vascular changes. Pupils are round equal and briskly reactive to light. CN III, IV, VI: extraocular movement are normal. No ptosis. CN V: Facial sensation is intact to pinprick in all 3 divisions bilaterally. Corneal responses are intact.  CN VII: Face is symmetric with normal eye closure and smile. CN VIII: Hearing is normal to rubbing fingers CN IX, X: Palate elevates symmetrically. Phonation is normal. CN XI: Head turning and shoulder shrug are intact CN XII: Tongue is midline with normal movements and no atrophy.  MOTOR: Moving four extremity without difficulty REFLEXES: Reflexes are 2+ and symmetric at the biceps, triceps, knees, and ankles. Plantar responses are flexor.  SENSORY: Intact to light touch, pinprick, position sense, and vibration sense are intact in fingers and toes.  COORDINATION: Rapid alternating movements and fine finger movements are intact. There is no dysmetria on finger-to-nose and heel-knee-shin.    GAIT/STANCE: Multiple tends to get up from seated position, unsteady,   DIAGNOSTIC DATA (LABS, IMAGING, TESTING) - I reviewed patient records, labs, notes, testing and imaging myself where available.   ASSESSMENT AND PLAN  TULLY MCINTURFF is a 76 y.o. male  presented with gradual onset memory trouble, family history of dementia   Alzheimer's dementia, rapid worsening  We have personally reviewed MRI  of the brain showed generalized atrophy especially perisylvian  area atrophy, small vessel disease in 2016  Rapid worsening worsening  Laboratory evaluations Showed no treatable cause  Continue Namenda 10 mg twice a day   May hold off Aricept 10 mg daily, which can cause decreased appetite    Carlos Cardenas, M.D. Ph.D.  [facility] [address] [place], [place] [address] Ph: [phone] Fax: [phone]  JH:[id number] Carmelia Roller, MD

## 2017-07-26 ENCOUNTER — Other Ambulatory Visit: Payer: Self-pay | Admitting: *Deleted

## 2017-07-26 NOTE — Patient Outreach (Addendum)
[facility]) Care Management  07/26/2017  [name] 07/16/1940 [id number]   Referral received 1/11 from [facility] (Transition of care) Initial outreach call unsuccessful however RN able to leave a HIPAA approved voice message requesting a call back. Will rescheduled another outreach call accordingly for tomorrow.   Elliot CousinLisa Stevenson Windmiller, RN Care Management Coordinator [facility] Main Office [phone]

## 2017-07-27 ENCOUNTER — Other Ambulatory Visit: Payer: Self-pay | Admitting: *Deleted

## 2017-07-27 NOTE — Patient Outreach (Signed)
[facility]) Care Management  07/27/2017  Carlos Cardenas 01/14/1941 [id number]    RN spoke with relative today with no information discussed about this pt only the need to obtaining a consent to speak with this person concerning the referred pt who has Dementia.  No provider is listed in Epic to call and inquire on possible history of this relative on any office consents. Therefore RN requested relative to [facility] and provided to fax number. [facility] [place] [facility] so [facility] will [facility] the requested [facility]. RN has also contacted the office concerning this incoming fax and requested the office to contact this RN case manager upon receiving this information in order to proceed with [facility].    Elliot CousinLisa Caris Cerveny, RN Care Management Coordinator [facility] Main Office [phone]

## 2017-07-27 NOTE — Patient Outreach (Signed)
[facility]) Care Management  07/27/2017  Carlos Cardenas 11/07/1940 [id number]    RN 2nd outreach attempts and returning a call today to a number left via VM from pt's family/spouse Carlos Dess(Barbara Vessels). RN able to leave a voice message requesting a call back. Will continue outreach attempts accordingly.  Carlos CousinLisa Matthews, RN Care Management Coordinator [facility] Main Office [phone]

## 2017-07-28 ENCOUNTER — Other Ambulatory Visit: Payer: Self-pay | Admitting: *Deleted

## 2017-07-28 NOTE — Patient Outreach (Signed)
[facility]) Care Management  07/28/2017  [name] 03/12/1941 [id number]    RN received verification of pt's wife Carlos Cardenas(Barbara Penson) is the POA due to pt's diagnosis of Dementia/Alzheimer. RN returned a call to pt's spouse and verified pt's identifiers and inquired if this was a good time to discuss pt's needs. Wife requested a call back tomorrow. RN will follow up tomorrow and inquired more on pt's needs and possible [facility] involved for community disease management services.   Elliot CousinLisa Digna Countess, RN Care Management Coordinator [facility] Main Office [phone]

## 2017-07-29 ENCOUNTER — Other Ambulatory Visit: Payer: Self-pay | Admitting: *Deleted

## 2017-07-30 DIAGNOSIS — M84353A Stress fracture, unspecified femur, initial encounter for fracture: Secondary | ICD-10-CM | POA: Diagnosis not present

## 2017-07-30 NOTE — Patient Outreach (Signed)
[facility]) Care Management  07/29/2017  Carlos Cardenas 01/16/1941 [id number]  Note referral on Media indicated possible d/c 1/12 outside facility related to recent falls resulting in several fractures. Pt also diagnosed with Dementia/Alzheimer reported by [facility] referral.  Wife Carlos Cardenas(Carlos Cardenas) verified as POA with faxed [facility]. RN spoke with spouse today and introduced [facility] program and purpose for the referral received to address pt with his ongoing recovery. Wife states pt will not discharge until tomorrow and [facility] for the next several weeks. Also verified pt has is primary provided at the [facility].  RN informed wife that [facility] will be updated and the request for a social worker to follow pt accordingly until he returns home for possible community if needed at that time. Wife very appreciative with no additional request or inquires. Due to the extended facility stay will close this case.   Carlos CousinLisa Rakeen Gaillard, RN Care Management Coordinator [facility] Main Office [phone]

## 2017-08-09 DIAGNOSIS — S72144A Nondisplaced intertrochanteric fracture of right femur, initial encounter for closed fracture: Secondary | ICD-10-CM | POA: Diagnosis not present

## 2017-08-09 DIAGNOSIS — M542 Cervicalgia: Secondary | ICD-10-CM | POA: Diagnosis not present

## 2017-08-18 ENCOUNTER — Other Ambulatory Visit: Payer: Self-pay | Admitting: *Deleted

## 2017-08-18 NOTE — Patient Outreach (Signed)
[facility]) Care Management  [facility] Care Manager  08/18/2017   Carlos Cardenas 09/22/1940 [id number]  Subjective: " He has been at [facility] for rehab for 18 days". Objective: [facility] CSW to assist patient and family with community based resources to aide in their well-being, quality of life and overall safety and needs.    Encounter Medications:  Outpatient Encounter Medications as of 08/18/2017  Medication Sig  . B Complex Vitamins (VITAMIN B-COMPLEX PO) Take by mouth.  . donepezil (ARICEPT) 10 MG tablet Take 1 tablet (10 mg total) by mouth at bedtime.  . Magnesium 400 MG CAPS Take by mouth daily.  . memantine (NAMENDA) 10 MG tablet Take 1 tablet (10 mg total) by mouth 2 (two) times daily.  . Multiple Vitamins-Minerals (MULTIVITAMIN ADULT PO) Take by mouth daily.  . Omega-3 Fatty Acids (FISH OIL) 1000 MG CAPS Take 2,000 mg by mouth daily.  . tamsulosin (FLOMAX) 0.4 MG CAPS capsule Take 0.4 mg by mouth daily.  . vitamin B-12 (CYANOCOBALAMIN) 1000 MCG tablet Take 1,000 mcg by mouth daily.   No facility-administered encounter medications on file as of 08/18/2017.     Functional Status:  No flowsheet data found.  Fall/Depression Screening: No flowsheet data found. No flowsheet data found.  Assessment:  CSW was able to make initial contact with patient's wife today to perform phone assessment, as well as assess and assist with social work needs and services.  Patient's wife returned my call (confirmed identity) and CSW introduced self, explained role and types of services provided through [employer] Care Management [facility] Care Management).   CSW then explained the reason for the call, indicating that patient  Was referred due to his being at [facility] rehab. Per wife, he has been at the SNF rehab for 18 days; prior to that, he was at a [place] facility in [facility].  Patient sustained a fall at home and had right femur and vertebrae fractures.  He had surgery on his femur  and per wife, has been progressing with SNF therapy.     Plan:  CSW will plan a f/u call to SNF and plan to follow to assess and assist with collaboration of needs.   [facility] CM Care Plan Problem One     Most Recent Value  Care Plan Problem One  Patient admitted to SNF rehab  Role Documenting the Problem One  Clinical Social Worker  Care Plan for Problem One  Active  [facility] Long Term Goal   Patient will receive therapy at SNF and have appropriate arrangements in place s/p fall and fx/ in tthe next 90 days.  [facility] Long Term Goal Start Date  08/18/17  Interventions for Problem One Long Term Goal  CSW discussed and will assist with collaboration of patient needs with SNF rep, patient and family.  [facility] CM Short Term Goal #1   Patient will participate with PT and OT at SNF daily while at SNF in the nxt 30 days.   [facility] CM Short Term Goal #1 Start Date  08/18/17  Interventions for Short Term Goal #1  CSW discussing importance of  therapy and participation .   [facility] CM Short Term Goal #2   Patient will be linked with appropriate resources for  home support in the next 30 days.   [facility] CM Short Term Goal #2 Start Date  08/18/17  Interventions for Short Term Goal #2  CSW will assess and assist with linking with  community resources and support.  Reece LevyJanet Crystalyn Delia, MSW, LCSW Clinical Social Worker  [facility] [phone]

## 2017-08-20 ENCOUNTER — Other Ambulatory Visit: Payer: Self-pay | Admitting: *Deleted

## 2017-08-20 ENCOUNTER — Ambulatory Visit: Payer: Self-pay | Admitting: *Deleted

## 2017-08-20 NOTE — Patient Outreach (Signed)
[facility]) Care Management  08/20/2017  Carlos Cardenas 07/04/1941 [id number]   CSW attempted to reach SNF rep today for update on progress and plans. Per wife,  "we have a 2 story home and he needs the rehab still".  CSW will be transferring this referral to Danford BadJoanna Saporito, LCSW, covering [facility] for further assessment and support.   Carlos LevyJanet Lyrick Cardenas, MSW, LCSW Clinical Social Worker  [facility] [phone]

## 2017-08-23 ENCOUNTER — Encounter: Payer: Self-pay | Admitting: *Deleted

## 2017-08-25 ENCOUNTER — Other Ambulatory Visit: Payer: Self-pay | Admitting: *Deleted

## 2017-08-25 ENCOUNTER — Ambulatory Visit: Payer: Self-pay | Admitting: *Deleted

## 2017-08-25 NOTE — Patient Outreach (Signed)
[facility]) Care Management  08/25/2017  [name] 09/06/1940 [id number]   CSW made an initial attempt to try and contact patient today to perform phone assessment, as well as assess and assist with social needs and services, without success.  A HIPAA compliant message was left for patient on voicemail.  CSW is currently awaiting a return call. CSW will make a second outreach attempt within the next week, if CSW does not receive a return call from patient in the meantime. Danford BadJoanna Saporito, BSW, MSW, LCSW  Licensed Restaurant manager, fast foodClinical Social Worker  [facility]  Mailing [place], [place], [place] [address] Physical Address-[address], [place], [place] [address] Toll Free Main # [phone] Fax # [phone] Cell # [phone]  Office # [phone] [email or web address]

## 2017-08-26 ENCOUNTER — Encounter: Payer: Self-pay | Admitting: *Deleted

## 2017-08-26 ENCOUNTER — Other Ambulatory Visit: Payer: Self-pay | Admitting: *Deleted

## 2017-08-26 NOTE — Patient Outreach (Signed)
[facility]) Care Management  08/26/2017  Carlos Cardenas Nov 20, 1940 [id number]   CSW was able to make contact with patient and patient's wife, Maxxon Schwanke today to perform the initial assessment, as well as assess and assist with social work needs and services.  CSW met with patient and Mrs. Lucado at [facility] [place] where patient currently resides to receive short-term rehabilitative services.  CSW introduced self, explained role and types of services provided through [facility] ([place] [facility]).  CSW further explained to patient and Mrs. Sales that CSW works with patient's RNCM, also with [facility], Raina Mina. CSW then explained the reason for the visit, indicating that Ms. Zigmund Daniel thought that patient would benefit from social work services and resources to assist with discharge planning needs and services from the skilled nursing facility.  CSW obtained two HIPAA compliant identifiers from Mrs. Vandeventer, which included patient's name and date of birth. Mrs. Bonebrake explained to CSW that patient will not be returning home to live once he has completed therapies (both physical and occupational), as she reports that she is no longer able to adequately care for patient in the home due to his Dementia.  Mrs. Cerullo went on to say that Loree Fee, Education officer, museum at [facility] is currently working with her on pursuing long-term care placement arrangements for patient.  Mrs. Rieger denied having any social work needs from this CSW, at present, agreeing to work with [employer] on long-term care placement arrangements for patient. CSW will perform a case closure on patient, as all goals of treatment have been met from social work standpoint and no additional social work needs have been identified at this time. CSW will notify patient's RNCM with [facility], Raina Mina of  CSW's plans to close patient's case.  CSW will fax an update to patient's Primary Care Physician to ensure that they are aware of CSW's involvement with patient's plan of care.  CSW will submit a case closure request to Alycia Rossetti, Care Management Assistant with [facility], in the form of an [employer].  CSW will ensure that Mrs. Arelia Sneddon is aware of Ms. Zigmund Daniel, RNCM with [facility] [place] [facility], continued involvement with patient's care. [facility] CM Care Plan Problem One     Most Recent Value  Care Plan Problem One  Patient admitted to SNF rehab  Role Documenting the Problem One  Clinical Social Worker  Care Plan for Problem One  Active  [facility] Long Term Goal   Patient will receive therapy at SNF and have appropriate arrangements in place s/p fall and fx/ in tthe next 90 days.  [facility] Long Term Goal Start Date  08/18/17  [facility] Long Term Goal Met Date  08/26/17  Interventions for Problem One Long Term Goal  CSW discussed and will assist with collaboration of patient needs with SNF rep, patient and family.  [facility] CM Short Term Goal #1   Patient will participate with PT and OT at SNF daily while at SNF in the nxt 30 days.   [facility] CM Short Term Goal #1 Start Date  08/18/17  [facility] CM Short Term Goal #1 Met Date  08/26/17  Interventions for Short Term Goal #1  CSW discussing importance of  therapy and participation .   [facility] CM Short Term Goal #2   Patient will be linked with appropriate resources for long-term care placement, within the next 30 days.  [facility] CM Short Term Goal #2 Start Date  08/18/17  [facility] CM Short Term Goal #2 Met Date  08/26/17  Interventions for Short Term Goal #2  CSW will assess and assist with linking with community resources and support.     Nat Christen, BSW, MSW, LCSW  Licensed Education officer, environmental Health System  Mailing [address] [place], [place] [address] Physical  Address-[address], [place],  [address] Toll Free Main # [phone] Fax # [phone] Cell # [phone]  Office # [phone] [email or web address]

## 2017-09-08 DIAGNOSIS — S72144D Nondisplaced intertrochanteric fracture of right femur, subsequent encounter for closed fracture with routine healing: Secondary | ICD-10-CM | POA: Diagnosis not present

## 2017-09-09 ENCOUNTER — Ambulatory Visit: Payer: PPO | Admitting: *Deleted

## 2017-11-01 DIAGNOSIS — M6281 Muscle weakness (generalized): Secondary | ICD-10-CM | POA: Diagnosis not present

## 2017-11-01 DIAGNOSIS — R293 Abnormal posture: Secondary | ICD-10-CM | POA: Diagnosis not present

## 2017-11-02 DIAGNOSIS — H43813 Vitreous degeneration, bilateral: Secondary | ICD-10-CM | POA: Diagnosis not present

## 2017-11-02 DIAGNOSIS — H2513 Age-related nuclear cataract, bilateral: Secondary | ICD-10-CM | POA: Diagnosis not present

## 2017-11-02 DIAGNOSIS — H04123 Dry eye syndrome of bilateral lacrimal glands: Secondary | ICD-10-CM | POA: Diagnosis not present

## 2017-11-02 DIAGNOSIS — H353131 Nonexudative age-related macular degeneration, bilateral, early dry stage: Secondary | ICD-10-CM | POA: Diagnosis not present

## 2017-11-10 ENCOUNTER — Ambulatory Visit: Payer: PPO | Admitting: Neurology

## 2017-11-10 DIAGNOSIS — M6281 Muscle weakness (generalized): Secondary | ICD-10-CM | POA: Diagnosis not present

## 2017-11-10 DIAGNOSIS — R293 Abnormal posture: Secondary | ICD-10-CM | POA: Diagnosis not present

## 2017-11-10 DIAGNOSIS — R278 Other lack of coordination: Secondary | ICD-10-CM | POA: Diagnosis not present

## 2017-12-13 DIAGNOSIS — R1312 Dysphagia, oropharyngeal phase: Secondary | ICD-10-CM | POA: Diagnosis not present

## 2017-12-13 DIAGNOSIS — M6281 Muscle weakness (generalized): Secondary | ICD-10-CM | POA: Diagnosis not present

## 2017-12-13 DIAGNOSIS — R278 Other lack of coordination: Secondary | ICD-10-CM | POA: Diagnosis not present

## 2017-12-13 DIAGNOSIS — R293 Abnormal posture: Secondary | ICD-10-CM | POA: Diagnosis not present

## 2017-12-15 ENCOUNTER — Emergency Department (HOSPITAL_COMMUNITY)
Admission: EM | Admit: 2017-12-15 | Discharge: 2017-12-15 | Disposition: A | Discharge status: Home / Self Care | Payer: PPO | Attending: Emergency Medicine | Admitting: Emergency Medicine

## 2017-12-15 ENCOUNTER — Other Ambulatory Visit: Payer: Self-pay

## 2017-12-15 ENCOUNTER — Encounter (HOSPITAL_COMMUNITY): Payer: Self-pay

## 2017-12-15 ENCOUNTER — Emergency Department (HOSPITAL_COMMUNITY): Payer: PPO

## 2017-12-15 DIAGNOSIS — F0390 Unspecified dementia without behavioral disturbance: Secondary | ICD-10-CM | POA: Diagnosis not present

## 2017-12-15 DIAGNOSIS — Z87891 Personal history of nicotine dependence: Secondary | ICD-10-CM | POA: Insufficient documentation

## 2017-12-15 DIAGNOSIS — J189 Pneumonia, unspecified organism: Secondary | ICD-10-CM | POA: Diagnosis not present

## 2017-12-15 DIAGNOSIS — I491 Atrial premature depolarization: Secondary | ICD-10-CM | POA: Diagnosis not present

## 2017-12-15 DIAGNOSIS — E1165 Type 2 diabetes mellitus with hyperglycemia: Secondary | ICD-10-CM | POA: Diagnosis not present

## 2017-12-15 DIAGNOSIS — D649 Anemia, unspecified: Secondary | ICD-10-CM | POA: Diagnosis not present

## 2017-12-15 DIAGNOSIS — R030 Elevated blood-pressure reading, without diagnosis of hypertension: Secondary | ICD-10-CM | POA: Diagnosis not present

## 2017-12-15 DIAGNOSIS — Z79899 Other long term (current) drug therapy: Secondary | ICD-10-CM | POA: Diagnosis not present

## 2017-12-15 DIAGNOSIS — Z7401 Bed confinement status: Secondary | ICD-10-CM | POA: Diagnosis not present

## 2017-12-15 DIAGNOSIS — M2550 Pain in unspecified joint: Secondary | ICD-10-CM | POA: Diagnosis not present

## 2017-12-15 DIAGNOSIS — J181 Lobar pneumonia, unspecified organism: Secondary | ICD-10-CM | POA: Diagnosis not present

## 2017-12-15 DIAGNOSIS — I10 Essential (primary) hypertension: Secondary | ICD-10-CM | POA: Diagnosis not present

## 2017-12-15 LAB — URINALYSIS, ROUTINE W REFLEX MICROSCOPIC
Bilirubin Urine: NEGATIVE
GLUCOSE, UA: 150 mg/dL — AB
Hgb urine dipstick: NEGATIVE
Ketones, ur: NEGATIVE mg/dL
LEUKOCYTES UA: NEGATIVE
NITRITE: NEGATIVE
PH: 7 (ref 5.0–8.0)
Protein, ur: NEGATIVE mg/dL
SPECIFIC GRAVITY, URINE: 1.017 (ref 1.005–1.030)

## 2017-12-15 LAB — CBC WITH DIFFERENTIAL/PLATELET
BASOS PCT: 1 %
Basophils Absolute: 0 10*3/uL (ref 0.0–0.1)
EOS PCT: 2 %
Eosinophils Absolute: 0.2 10*3/uL (ref 0.0–0.7)
HCT: 35.1 % — ABNORMAL LOW (ref 39.0–52.0)
HEMOGLOBIN: 11.8 g/dL — AB (ref 13.0–17.0)
Lymphocytes Relative: 19 %
Lymphs Abs: 1.7 10*3/uL (ref 0.7–4.0)
MCH: 32.4 pg (ref 26.0–34.0)
MCHC: 33.6 g/dL (ref 30.0–36.0)
MCV: 96.4 fL (ref 78.0–100.0)
Monocytes Absolute: 0.5 10*3/uL (ref 0.1–1.0)
Monocytes Relative: 5 %
NEUTROS PCT: 73 %
Neutro Abs: 6.4 10*3/uL (ref 1.7–7.7)
PLATELETS: 258 10*3/uL (ref 150–400)
RBC: 3.64 MIL/uL — AB (ref 4.22–5.81)
RDW: 13.1 % (ref 11.5–15.5)
WBC: 8.8 10*3/uL (ref 4.0–10.5)

## 2017-12-15 LAB — COMPREHENSIVE METABOLIC PANEL
ALBUMIN: 3.1 g/dL — AB (ref 3.5–5.0)
ALK PHOS: 89 U/L (ref 38–126)
ALT: 15 U/L — AB (ref 17–63)
ANION GAP: 8 (ref 5–15)
AST: 23 U/L (ref 15–41)
BUN: 15 mg/dL (ref 6–20)
CALCIUM: 8.6 mg/dL — AB (ref 8.9–10.3)
CHLORIDE: 103 mmol/L (ref 101–111)
CO2: 25 mmol/L (ref 22–32)
Creatinine, Ser: 0.67 mg/dL (ref 0.61–1.24)
GFR calc non Af Amer: >60 mL/min (ref >60)
GLUCOSE: 272 mg/dL — AB (ref 65–99)
Potassium: 3.7 mmol/L (ref 3.5–5.1)
Sodium: 136 mmol/L (ref 135–145)
Total Bilirubin: 0.3 mg/dL (ref 0.3–1.2)
Total Protein: 6.2 g/dL — ABNORMAL LOW (ref 6.5–8.1)

## 2017-12-15 MED ORDER — LEVOFLOXACIN 750 MG PO TABS: 750.0000 mg | ORAL_TABLET | Freq: Every day | ORAL | 0 refills | Status: AC

## 2017-12-15 NOTE — ED Provider Notes (Signed)
[facility] Provider Note   CSN: [id number] Arrival date & time: 12/15/17  1223     History   Chief Complaint Chief Complaint  Patient presents with  . Hypertension    HPI [name] is a 77 y.o. male.  The history is provided by the patient and medical records. No language interpreter was used.  Hypertension  This is a chronic problem. The current episode started less than 1 hour ago. The problem occurs constantly. The problem has been resolved. Pertinent negatives include no chest pain, no abdominal pain, no headaches and no shortness of breath. Nothing aggravates the symptoms. Nothing relieves the symptoms. Treatments tried: clonidine x2 at facility. The treatment provided significant relief.    Past Medical History:  Diagnosis Date  . Memory loss   . Ringing in ears     Patient Active Problem List   Diagnosis Date Noted  . Dementia 06/04/2015    Past Surgical History:  Procedure Laterality Date  . KNEE SURGERY Right    2000        Home Medications    Prior to Admission medications   Medication Sig Start Date End Date Taking? Authorizing Provider  B Complex Vitamins (VITAMIN B-COMPLEX PO) Take by mouth.    [provider]  donepezil (ARICEPT) 10 MG tablet Take 1 tablet (10 mg total) by mouth at bedtime. 11/09/16   Levert FeinsteinYan, Yijun, MD  Magnesium 400 MG CAPS Take by mouth daily.    [provider]  memantine (NAMENDA) 10 MG tablet Take 1 tablet (10 mg total) by mouth 2 (two) times daily. 11/09/16   Levert FeinsteinYan, Yijun, MD  Multiple Vitamins-Minerals (MULTIVITAMIN ADULT PO) Take by mouth daily.    [provider]  Omega-3 Fatty Acids (FISH OIL) 1000 MG CAPS Take 2,000 mg by mouth daily.    [provider]  tamsulosin (FLOMAX) 0.4 MG CAPS capsule Take 0.4 mg by mouth daily.    [provider]  vitamin B-12 (CYANOCOBALAMIN) 1000 MCG tablet Take 1,000 mcg by mouth daily.    [provider]    Family History Family History  Problem Relation Age of Onset  . Heart disease Mother   . Tuberculosis Father     Social History Social History   Tobacco Use  . Smoking status: Former Games developermoker  . Smokeless tobacco: Never Used  . Tobacco comment: Quit 1965  Substance Use Topics  . Alcohol use: Yes    Alcohol/week: 0.0 oz    Comment: Seldom  . Drug use: No     Allergies   Patient has no known allergies.   Review of Systems Review of Systems  Unable to perform ROS: Dementia  Constitutional: Negative for chills, fatigue and fever.  Respiratory: Negative for shortness of breath.   Cardiovascular: Negative for chest pain.  Gastrointestinal: Negative for abdominal pain.  Neurological: Negative for headaches.  All other systems reviewed and are negative.    Physical Exam Updated Vital Signs BP 131/73 (BP Location: Left Arm)   Pulse 74   Temp 98.3 F (36.8 C) (Rectal)   Resp 16   SpO2 99%   Physical Exam  Constitutional: He appears well-developed and well-nourished. No distress.  HENT:  Head: Normocephalic and atraumatic.  Mouth/Throat: Oropharynx is clear and moist.  Eyes: Pupils are equal, round, and reactive to light. EOM are normal.  Neck: Neck supple.  Cardiovascular: Normal rate and intact distal pulses.  No murmur heard. Pulmonary/Chest: Effort normal. No respiratory  distress. He has no wheezes. He has no rales.  Abdominal: There is no tenderness.  Musculoskeletal: He exhibits no edema or tenderness.  Neurological: He is alert.  Skin: Capillary refill takes less than 2 seconds. He is not diaphoretic. No erythema. No pallor.  Nursing note and vitals reviewed.    ED Treatments / Results  Labs (all labs ordered are listed, but only abnormal results are displayed) Labs Reviewed  CBC WITH DIFFERENTIAL/PLATELET - Abnormal; Notable for the following components:      Result Value   RBC 3.64 (*)    Hemoglobin 11.8 (*)    HCT 35.1 (*)    All other  components within normal limits  COMPREHENSIVE METABOLIC PANEL - Abnormal; Notable for the following components:   Glucose, Bld 272 (*)    Calcium 8.6 (*)    Total Protein 6.2 (*)    Albumin 3.1 (*)    ALT 15 (*)    All other components within normal limits  URINALYSIS, ROUTINE W REFLEX MICROSCOPIC - Abnormal; Notable for the following components:   APPearance HAZY (*)    Glucose, UA 150 (*)    All other components within normal limits  URINE CULTURE    EKG EKG Interpretation  Date/Time:  Wednesday December 15 2017 12:34:45 EDT Ventricular Rate:  82 PR Interval:    QRS Duration: 102 QT Interval:  440 QTC Calculation: 514 R Axis:   76 Text Interpretation:  Sinus rhythm Ventricular premature complex Abnormal R-wave progression, early transition Inferior infarct, old Consider anterolateral infarct Prolonged QT interval No prior ECG for comparison.  No STEMI Confirmed by Theda Belfast ([id number]) on 12/15/2017 2:59:47 PM   Radiology Dg Chest 2 View  Result Date: 12/15/2017 CLINICAL DATA:  Hypertensive. EXAM: CHEST - 2 VIEW COMPARISON:  None. FINDINGS: Left lower lobe consolidation. No pleural effusion. Cardiomediastinal silhouette is normal. Calcified aortic arch. No pneumothorax. Mild degenerative change of the thoracic spine. IMPRESSION: Left lower lobe consolidation concerning for pneumonia. Followup PA and lateral chest X-ray is recommended in 3-4 weeks following trial of antibiotic therapy to ensure resolution and exclude underlying malignancy. Aortic Atherosclerosis (ICD10-I70.0). Electronically Signed   By: Awilda Metro M.D.   On: 12/15/2017 14:50    Procedures Procedures (including critical care time)  Medications Ordered in ED Medications - No data to display   Initial Impression / Assessment and Plan / ED Course  I have reviewed the triage vital signs and the nursing notes.  Pertinent labs & imaging results that were available during my care of the patient were reviewed  by me and considered in my medical decision making (see chart for details).     Carlos Cardenas is a 77 y.o. male with a past medical history significant for nonverbal dementia who also has high blood pressure who presents for elevated blood pressure from his facility.  According to wife who spends time with patient, patient has been acting fairly normally but today had a blood pressure of greater than 200.  They gave 0.1 mg of clonidine and the blood pressure only improved into the 190s.  They have a second 0.1 of clonidine and sent him to the ED.  On his arrival he has been normotensive.  Patient otherwise was at his baseline per family report and had no other complaints.  On my exam, lungs were clear.  Chest is nontender.  Abdomen is nontender.  Patient was unable to answer any questions however family says he is at his baseline mental status.  No rashes or traumatic injuries were seen.    Based on the report of elevated blood pressure, patient will have brief work-up to look for occult infection that may have caused his blood pressure fulgurations.    Chest x-ray shows pneumonia.  Patient is maintaining his oxygen saturations on room air, do not feel he needs admission however patient will give prescription for antibiotics.  Urinalysis was reassuring and patient blood pressure remained stable in the ED.    Patient will be discharged with antibiotics and will follow-up with his PCP.  Family agrees and understands plan of care.  They understood return precautions.  Patient discharged in good condition.                Final Clinical Impressions(s) / ED Diagnoses   Final diagnoses:  Elevated blood pressure reading  Community acquired pneumonia of left lower lobe of lung [facility])    ED Discharge Orders        Ordered    levofloxacin (LEVAQUIN) 750 MG tablet  Daily     12/15/17 1538      Clinical Impression: 1. Elevated blood pressure reading   2. Community acquired pneumonia of left lower  lobe of lung (HCC)     Disposition: Discharge  Condition: Good  I have discussed the results, Dx and Tx plan with the pt(& family if present). He/she/they expressed understanding and agree(s) with the plan. Discharge instructions discussed at great length. Strict return precautions discussed and pt &/or family have verbalized understanding of the instructions. No further questions at time of discharge.    New Prescriptions   LEVOFLOXACIN (LEVAQUIN) 750 MG TABLET    Take 1 tablet (750 mg total) by mouth daily for 5 days.    Follow Up: [facility] [address] [place] [address] [id number]     [facility] [address] [id number] mc [address] [place] [address] [phone]       Tegeler, Canary Brim, MD 12/15/17 [id number]

## 2017-12-15 NOTE — ED Triage Notes (Signed)
He is normally a non-verbal dementia pt. [facility] [place] with c/o according to their staff that pt. Had elevated b/p without any other problem/symptom. He arrives here in no distress. He has had am. B.m., which we cleanse from his bottom upon arrival. He is awake, alert and non-verbal. His skin is normal, warm and dry and he is breathing normally. He has a golden DNR form with him.

## 2017-12-15 NOTE — ED Triage Notes (Signed)
Recorded b/p 0900: 194/110, at which time he received a Clonidine 0.1mg  table p.o. B/p at 1000: 208/100, at which time they gave an additional Clonidine 0.1mg  tablet.

## 2017-12-15 NOTE — ED Notes (Signed)
Bed: [id number] Expected date:  Expected time:  Means of arrival:  Comments: Critical patient in section. [facility]

## 2017-12-15 NOTE — Discharge Instructions (Signed)
His work-up today showed evidence of pneumonia.  This may have contributed to the elevated blood pressure he had at his facility.  Given his demonstrated stable blood pressures and is otherwise reassuring work-up, we feel he is safe for discharge home.  He did not need oxygen today which would require admission.  Please have him follow-up with his PCP in several days and take the antibiotics as prescribed.  If any symptoms change or worsen, please return to the nearest emergency department.

## 2017-12-15 NOTE — ED Notes (Signed)
Bed: RESB Expected date:  Expected time:  Means of arrival:  Comments: EMS/Hypertensive crisis

## 2017-12-16 LAB — URINE CULTURE: Culture: NO GROWTH

## 2017-12-23 DIAGNOSIS — M25641 Stiffness of right hand, not elsewhere classified: Secondary | ICD-10-CM | POA: Diagnosis not present

## 2017-12-24 DIAGNOSIS — M25641 Stiffness of right hand, not elsewhere classified: Secondary | ICD-10-CM | POA: Diagnosis not present

## 2017-12-24 DIAGNOSIS — Z09 Encounter for follow-up examination after completed treatment for conditions other than malignant neoplasm: Secondary | ICD-10-CM | POA: Diagnosis not present

## 2017-12-24 DIAGNOSIS — J189 Pneumonia, unspecified organism: Secondary | ICD-10-CM | POA: Diagnosis not present

## 2017-12-24 DIAGNOSIS — D649 Anemia, unspecified: Secondary | ICD-10-CM | POA: Diagnosis not present

## 2017-12-24 DIAGNOSIS — N390 Urinary tract infection, site not specified: Secondary | ICD-10-CM | POA: Diagnosis not present

## 2017-12-27 DIAGNOSIS — N390 Urinary tract infection, site not specified: Secondary | ICD-10-CM | POA: Diagnosis not present

## 2017-12-30 DIAGNOSIS — N390 Urinary tract infection, site not specified: Secondary | ICD-10-CM | POA: Diagnosis not present

## 2018-01-10 DIAGNOSIS — R278 Other lack of coordination: Secondary | ICD-10-CM | POA: Diagnosis not present

## 2018-01-10 DIAGNOSIS — R1312 Dysphagia, oropharyngeal phase: Secondary | ICD-10-CM | POA: Diagnosis not present

## 2018-01-10 DIAGNOSIS — R293 Abnormal posture: Secondary | ICD-10-CM | POA: Diagnosis not present

## 2018-01-14 DIAGNOSIS — I10 Essential (primary) hypertension: Secondary | ICD-10-CM | POA: Diagnosis not present

## 2018-01-14 DIAGNOSIS — R509 Fever, unspecified: Secondary | ICD-10-CM | POA: Diagnosis not present

## 2018-01-14 DIAGNOSIS — R0989 Other specified symptoms and signs involving the circulatory and respiratory systems: Secondary | ICD-10-CM | POA: Diagnosis not present

## 2018-01-14 DIAGNOSIS — R05 Cough: Secondary | ICD-10-CM | POA: Diagnosis not present

## 2018-01-19 DIAGNOSIS — I10 Essential (primary) hypertension: Secondary | ICD-10-CM | POA: Diagnosis not present

## 2018-01-20 DIAGNOSIS — J189 Pneumonia, unspecified organism: Secondary | ICD-10-CM | POA: Diagnosis not present

## 2018-01-20 DIAGNOSIS — D72829 Elevated white blood cell count, unspecified: Secondary | ICD-10-CM | POA: Diagnosis not present

## 2018-01-24 DIAGNOSIS — D649 Anemia, unspecified: Secondary | ICD-10-CM | POA: Diagnosis not present

## 2018-01-24 DIAGNOSIS — D72829 Elevated white blood cell count, unspecified: Secondary | ICD-10-CM | POA: Diagnosis not present

## 2018-02-23 DIAGNOSIS — F0150 Vascular dementia without behavioral disturbance: Secondary | ICD-10-CM | POA: Diagnosis not present

## 2018-02-23 DIAGNOSIS — M6281 Muscle weakness (generalized): Secondary | ICD-10-CM | POA: Diagnosis not present

## 2018-02-23 DIAGNOSIS — R278 Other lack of coordination: Secondary | ICD-10-CM | POA: Diagnosis not present

## 2018-02-23 DIAGNOSIS — R1312 Dysphagia, oropharyngeal phase: Secondary | ICD-10-CM | POA: Diagnosis not present

## 2018-02-23 DIAGNOSIS — R293 Abnormal posture: Secondary | ICD-10-CM | POA: Diagnosis not present

## 2018-04-12 DEATH — deceased

## 2018-12-06 IMAGING — CR DG CHEST 2V
2 series · 2 of 2 positions shown · non-contrast
Comparison: None.

CLINICAL DATA: Hypertensive.

EXAM:
CHEST - 2 VIEW

[w chest lat]
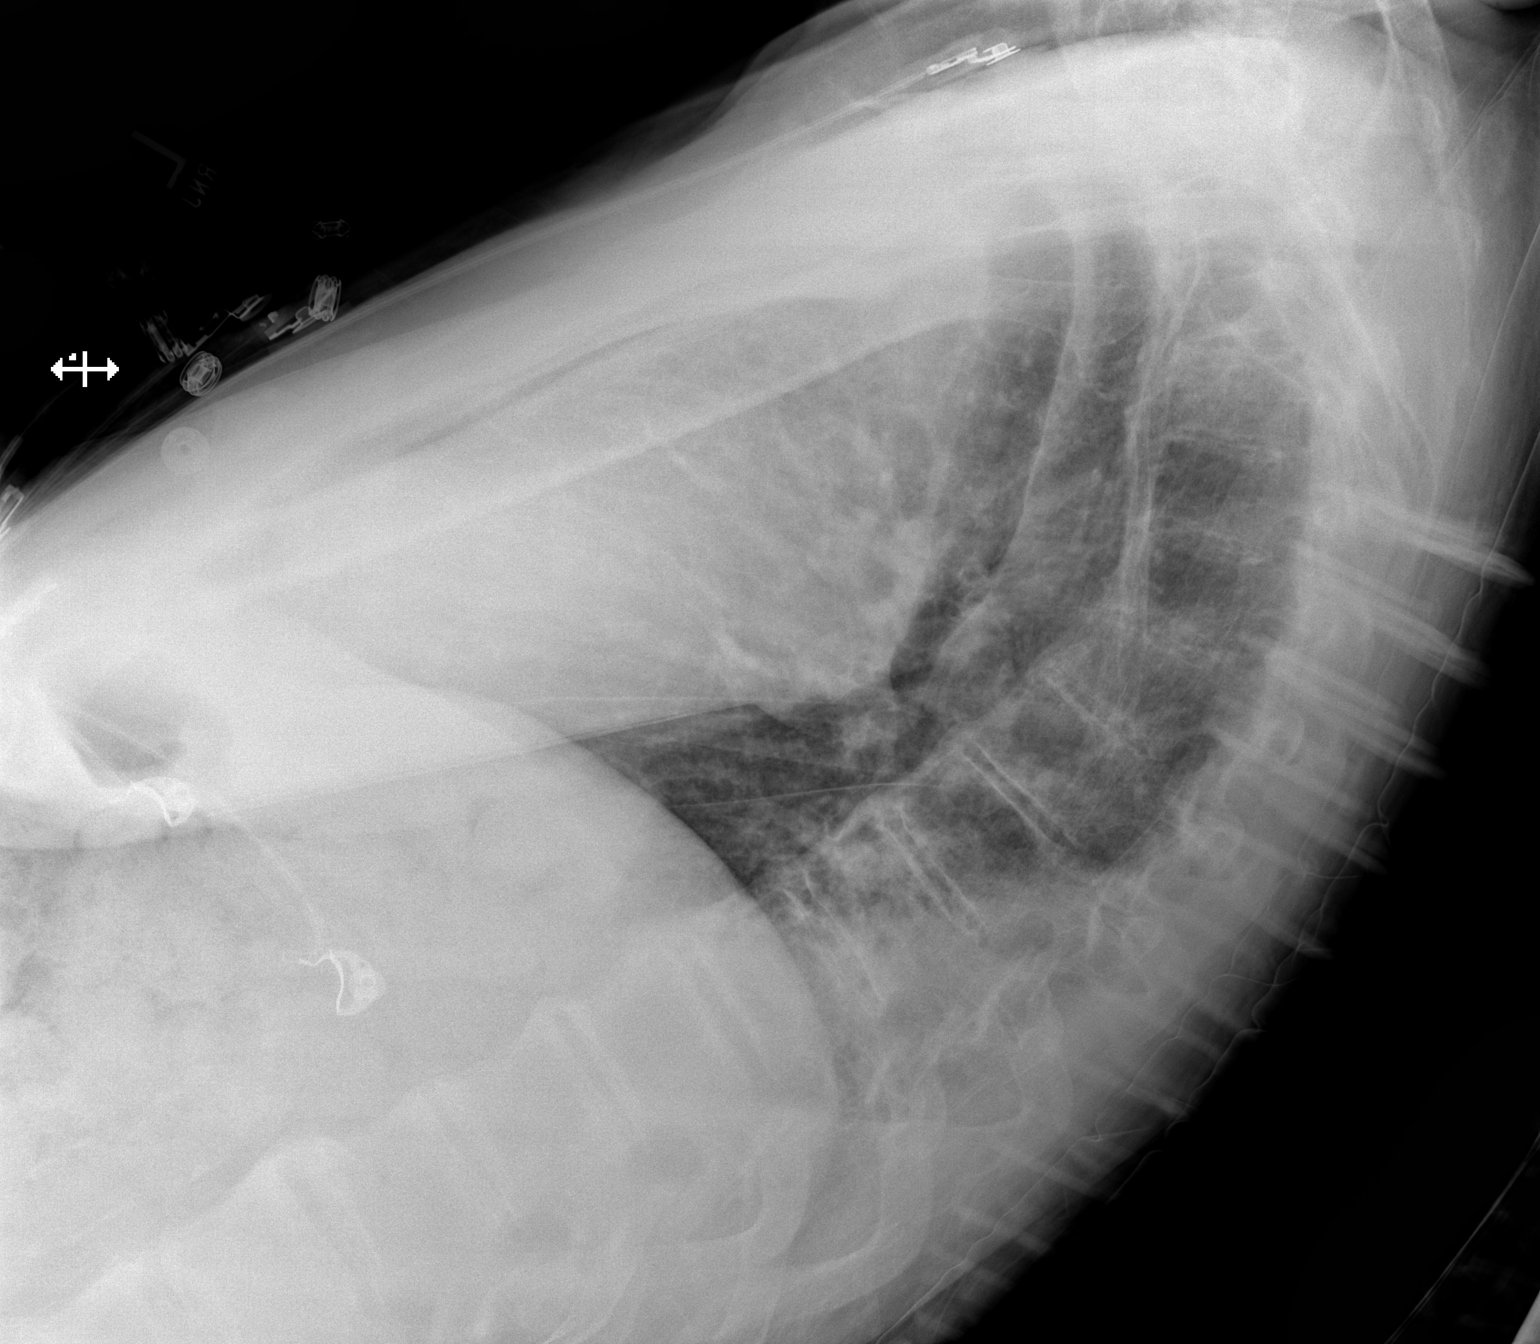

[x chest ap]
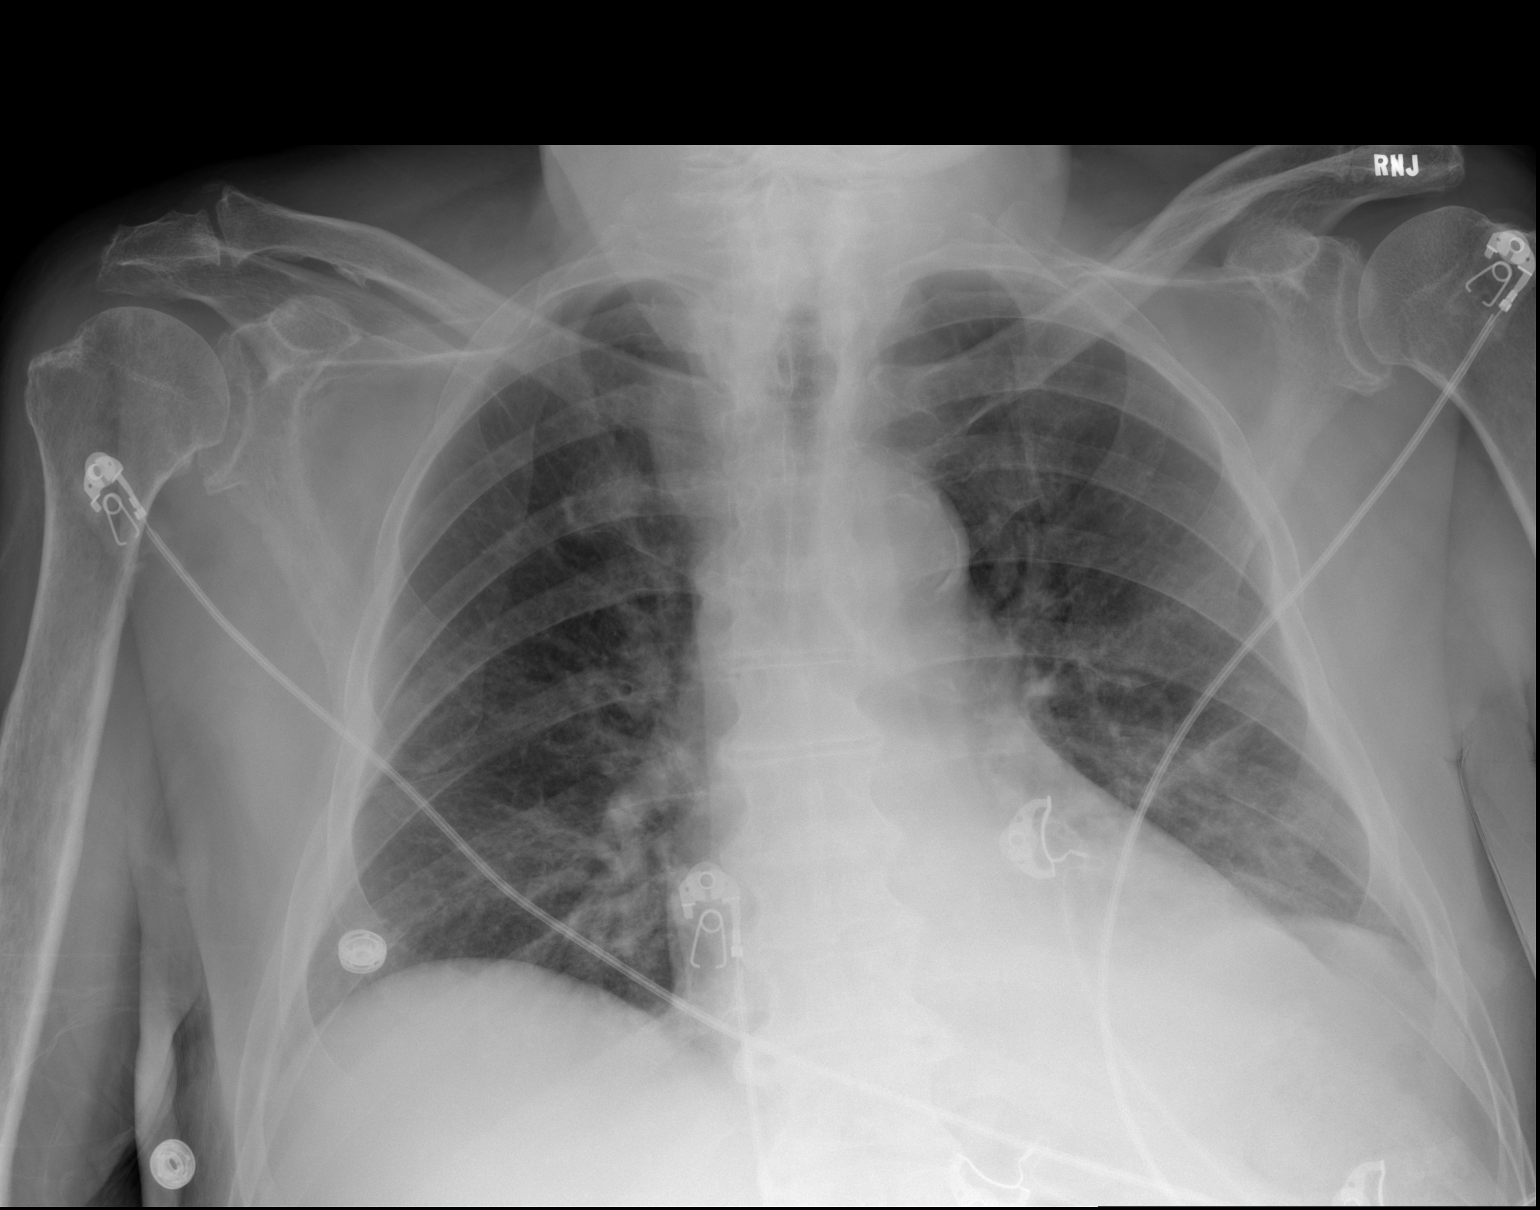

[2 of 2 positions shown; findings below may reference images not displayed]

FINDINGS: Left lower lobe consolidation. No pleural effusion.
Cardiomediastinal silhouette is normal. Calcified aortic arch. No
pneumothorax. Mild degenerative change of the thoracic spine.
IMPRESSION: Left lower lobe consolidation concerning for pneumonia. Followup PA
and lateral chest X-ray is recommended in 3-4 weeks following trial
of antibiotic therapy to ensure resolution and exclude underlying
malignancy.

Aortic Atherosclerosis ([id number]).
